# Patient Record
Sex: Female | Born: 1990 | Race: White | Hispanic: No | Marital: Single
Health system: Southern US, Community
[De-identification: ages and names within clinical notes are randomized; demographics above are authoritative.]

## PROBLEM LIST (undated history)

## (undated) ENCOUNTER — Emergency Department: Payer: Self-pay

## (undated) HISTORY — PX: OTHER SURGICAL HISTORY: SHX169

---

## 2012-02-26 ENCOUNTER — Ambulatory Visit: Payer: Self-pay | Admitting: Obstetrics & Gynecology

## 2013-01-06 ENCOUNTER — Ambulatory Visit: Payer: Self-pay | Admitting: Family Medicine

## 2014-02-08 ENCOUNTER — Ambulatory Visit: Payer: Self-pay | Admitting: Adult Health

## 2016-04-17 IMAGING — CT CT HEAD WITHOUT CONTRAST
1 series · 16 of 30 positions shown, 20 images · non-contrast
Comparison: None.

CLINICAL DATA: Glass fell on patient's head injuring left temple
last [REDACTED]. Now with blurry vision.

EXAM:
CT HEAD WITHOUT CONTRAST
TECHNIQUE: Contiguous axial images were obtained from the base of the skull
through the vertex without intravenous contrast.

[Series 2: head wo · axial · 0.42mm/px · z∈[+1119,+1247]mm · 16 of 30 slices shown, 20 images]
[im 2/30  brain]
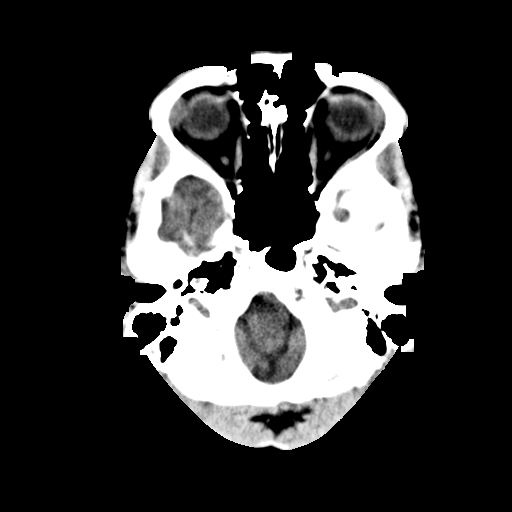
[im 2/30  bone]
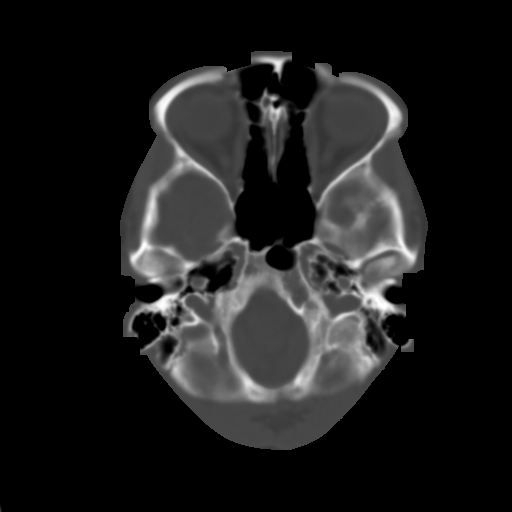
[im 4/30  brain]
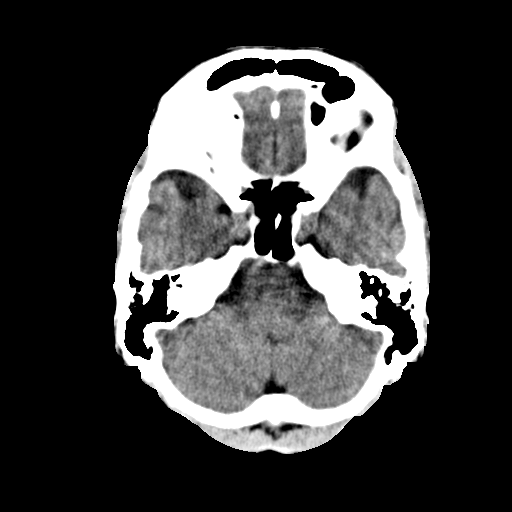
[im 6/30  brain]
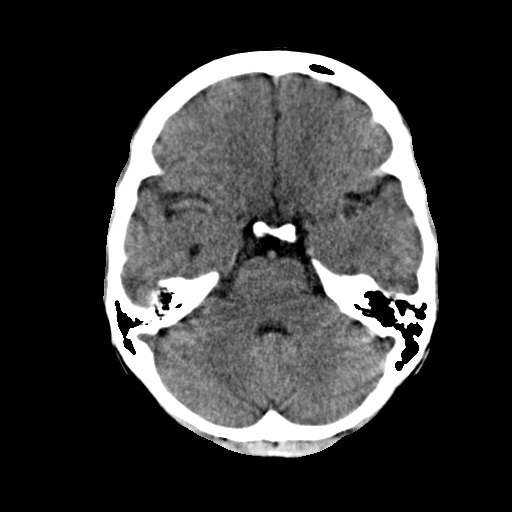
[im 8/30  brain]
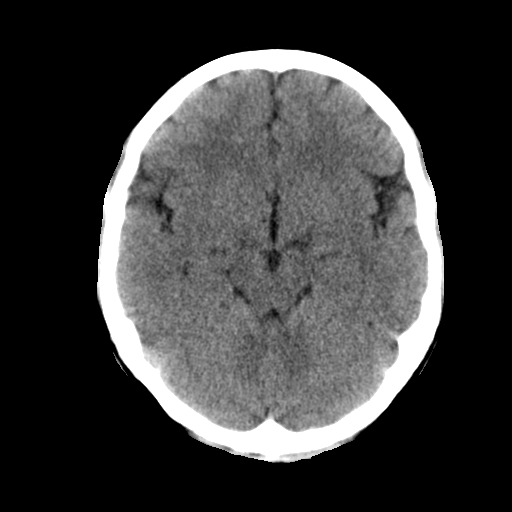
[im 9/30  brain]
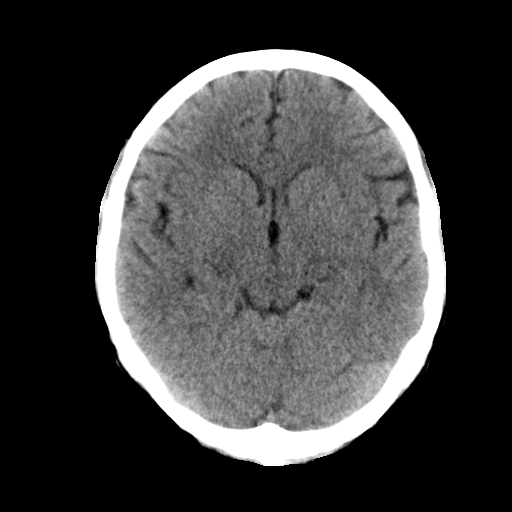
[im 9/30  bone]
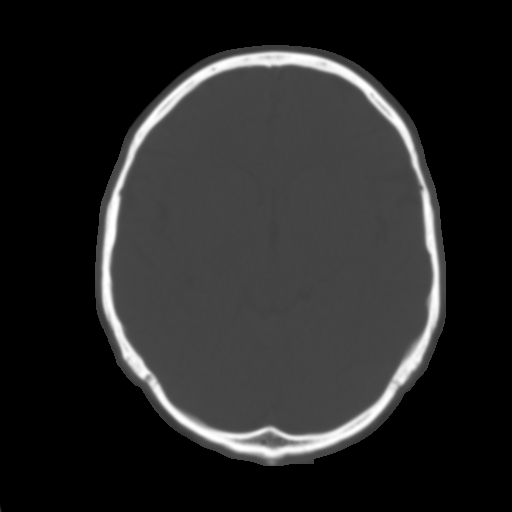
[im 11/30  brain]
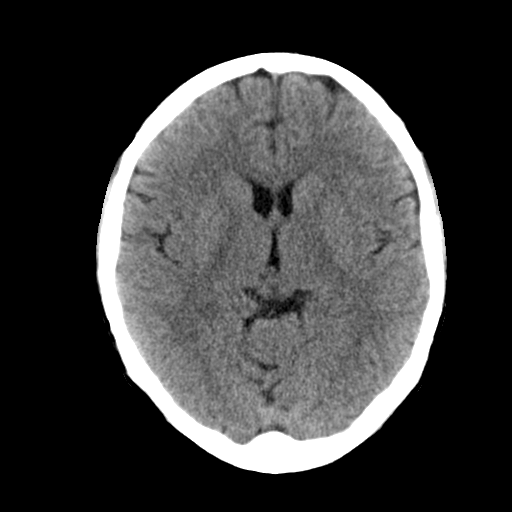
[im 13/30  brain]
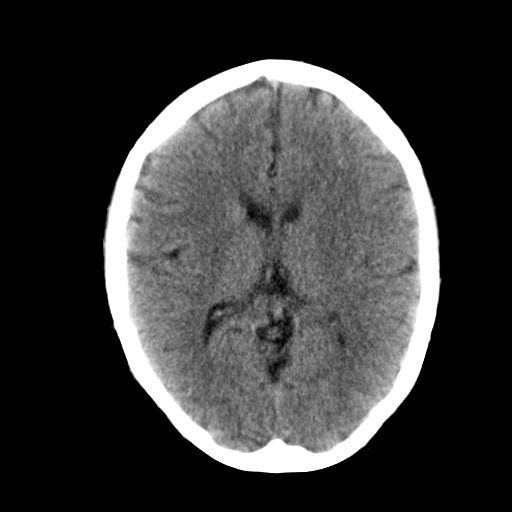
[im 15/30  brain]
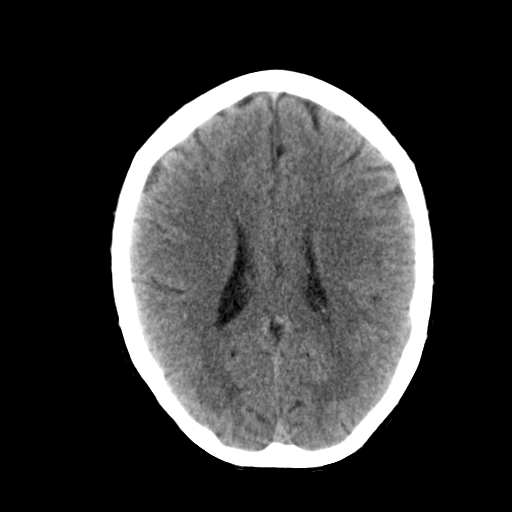
[im 16/30  brain]
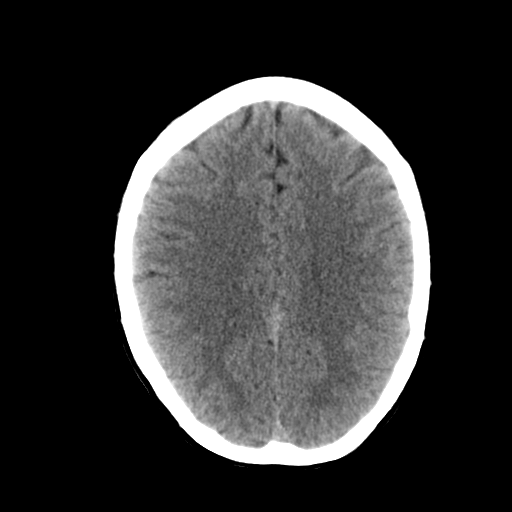
[im 16/30  bone]
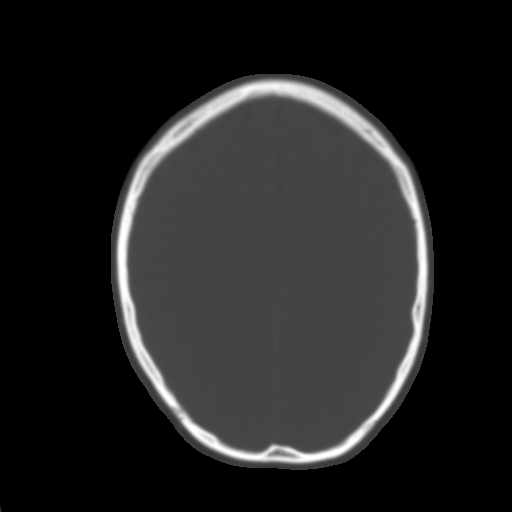
[im 18/30  brain]
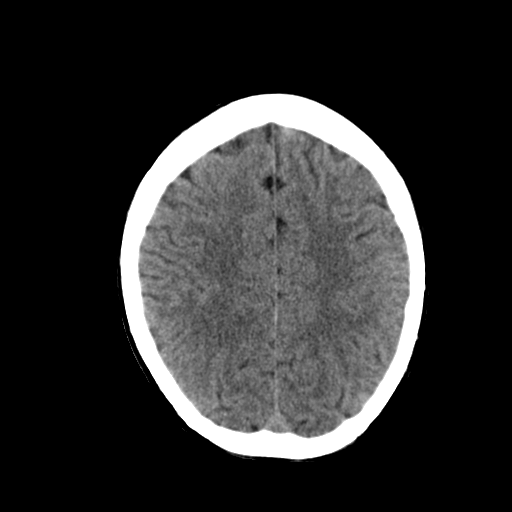
[im 20/30  brain]
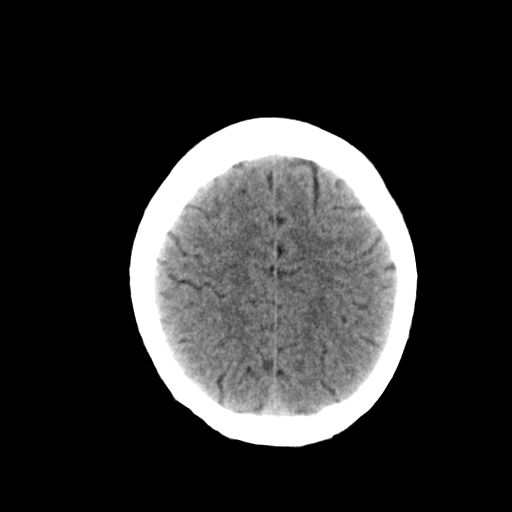
[im 22/30  brain]
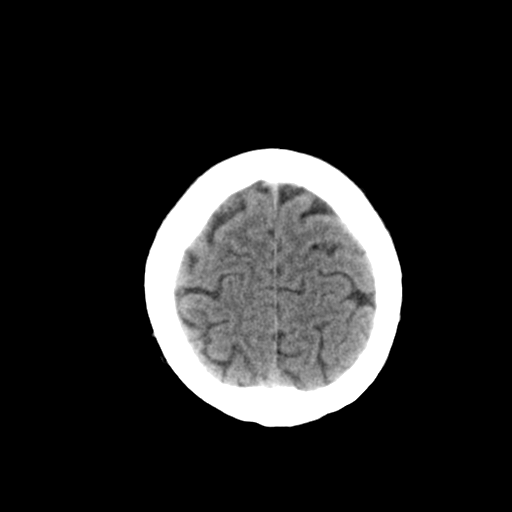
[im 23/30  brain]
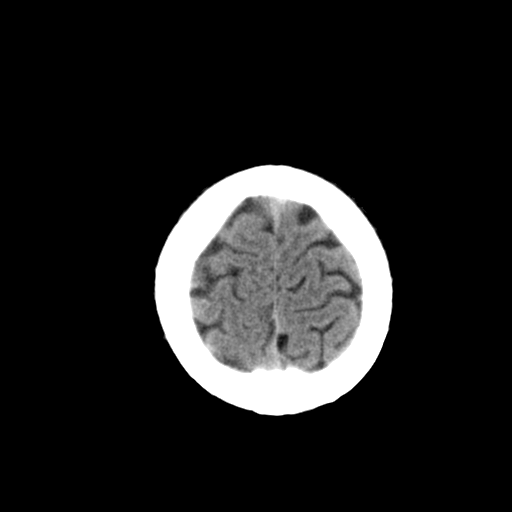
[im 23/30  bone]
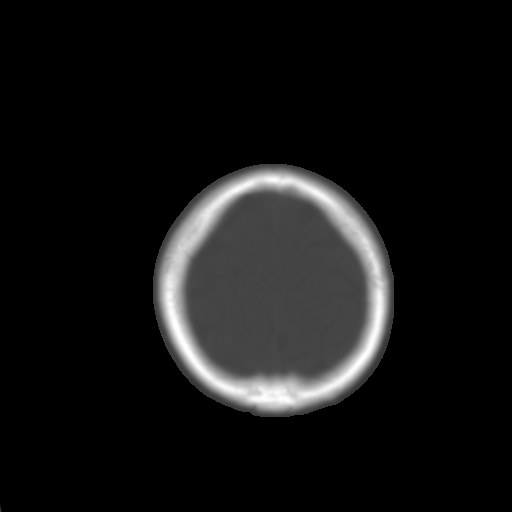
[im 25/30  brain]
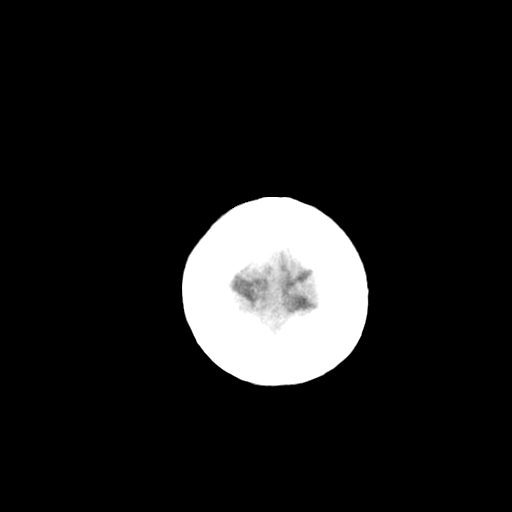
[im 27/30  brain]
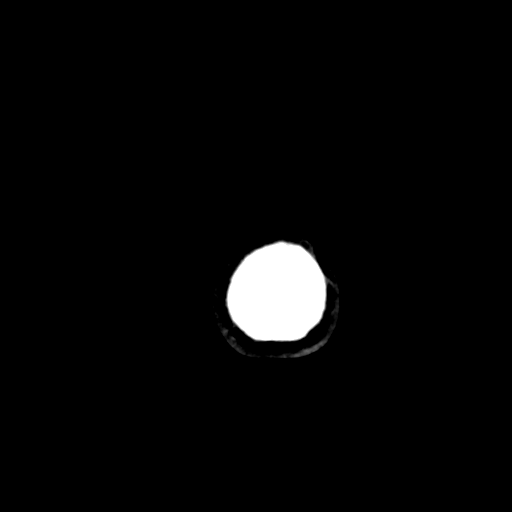
[im 29/30  brain]
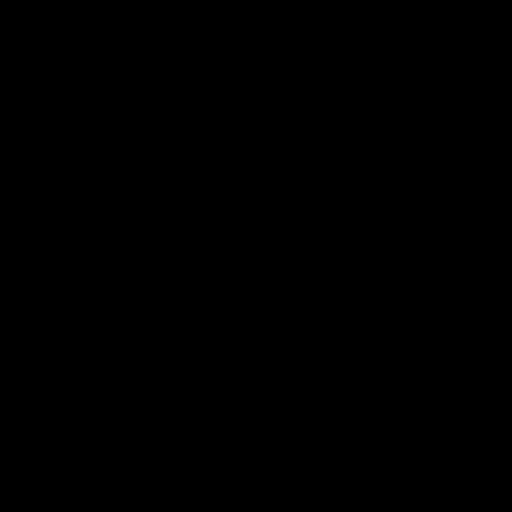

[16 of 30 positions shown; findings below may reference images not displayed]

FINDINGS: Regional soft tissues appear normal with special attention paid to
the soft tissues about the left temporal calvarium. No radiopaque
foreign body. No displaced calvarial fracture.

Gray-white differentiation is maintained. No CT evidence of acute
large territory infarct. No intraparenchymal or extra-axial mass or
hemorrhage. Normal size and configuration of the ventricles and
basilar cisterns. No midline shift.

Limited visualization of the paranasal sinuses and mastoid air cells
are normal. No air-fluid levels.
IMPRESSION: Negative noncontrast head CT. Specifically, no radiopaque foreign
body or displaced calvarial fracture.

## 2019-07-18 NOTE — Progress Notes (Signed)
07/19/19 12:52 PM   Brenda Scott 1990/10/16 [id number]  Referring provider: Darcella Gasman, MD [address] [place],  [place] [address] Chief Complaint  Patient presents with   Pelvic Pain    HPI: Brenda Scott is a 29 y.o. F who presents today for the evaluation and management of pelvic pain.   Visited PCP on 06/23/19 c/o of persistent bladder pain. Associated UA, culture and trial of conservative management (avoiding bladder irritants, warm compresses, hydration), and PE unremarkable.   She reports of constant urinary urgency however not able to void when she goes to restroom onset abruptly 2 months ago. Not bothersome at night.  When she is active, doing hair or keeping busy, she does not have as many issues.  If she sitting around, she often avoid as much as every 15 minutes.  No dysuria or gross hematuria.  She denies weight loss or weight gain, constipation, and bowel issues.   She primarily drinks water and a cup of coffee q morning. PVR minimal.   She reports of having an IUD and curious whether symptoms associated w/ possible pregnancy. She states of having 1 daughter.   PMH: History reviewed. No pertinent past medical history.  Surgical History: Past Surgical History:  Procedure Laterality Date   child birth      Home Medications:  Allergies as of 07/19/2019   Not on File     Medication List       Accurate as of July 19, 2019 12:52 PM. If you have any questions, ask your nurse or doctor.        levonorgestrel 20 MCG/24HR IUD Commonly known as: MIRENA 1 each by Intrauterine route once.   oxybutynin 5 MG tablet Commonly known as: DITROPAN Take 1 tablet (5 mg total) by mouth every 8 (eight) hours as needed for bladder spasms. Started by: Hollice Espy, MD       Allergies: Not on File  Family History: History reviewed. No pertinent family history.  Social History:  has no history on file for tobacco, alcohol, and  drug.   Physical Exam: BP 123/61    Pulse 85    Ht 5\' 3"  (1.6 m)    Wt 125 lb (56.7 kg)    LMP  (LMP Unknown) Comment: patient has IUD   BMI 22.14 kg/m   Constitutional:  Alert and oriented, No acute distress. HEENT: [place] AT, moist mucus membranes.  Trachea midline, no masses. Cardiovascular: No clubbing, cyanosis, or edema. Respiratory: Normal respiratory effort, no increased work of breathing. Skin: No rashes, bruises or suspicious lesions. Neurologic: Grossly intact, no focal deficits, moving all 4 extremities. Psychiatric: Normal mood and affect.  Laboratory Data:  Urinalysis Negative   Pertinent Imaging: Results for orders placed or performed in visit on 07/19/19  BLADDER SCAN AMB NON-IMAGING  Result Value Ref Range   Scan Result 35 ML    Assessment & Plan:    1. Pelvic pain  Intermittent, not severe Possibly related to #2   2. Urinary frequency/urgency  Symptoms fairly recent w/ abrupt onset UA today negative but will send off for ureaplasma and mycoplasma  Adequate emptying of bladder No issues w/ constipation as confounding factors or vaginal symptoms  Reviewed warning symptoms Will treat overactivity w/ oxybutynin Desires pregnancy test today    [facility] [address], [place], [place] [address] [phone]  I, Lucas Mallow, am acting as a scribe for Dr. Hollice Espy,  I have  reviewed the above documentation for accuracy and completeness, and I agree with the above.   Brenda Scotland, MD

## 2019-07-19 ENCOUNTER — Encounter: Payer: Self-pay | Admitting: Urology

## 2019-07-19 ENCOUNTER — Other Ambulatory Visit: Payer: Self-pay

## 2019-07-19 ENCOUNTER — Ambulatory Visit (INDEPENDENT_AMBULATORY_CARE_PROVIDER_SITE_OTHER): Payer: Self-pay | Admitting: Urology

## 2019-07-19 VITALS — BP 123/61 | HR 85 | Ht 63.0 in | Wt 125.0 lb

## 2019-07-19 DIAGNOSIS — R102 Pelvic and perineal pain: Secondary | ICD-10-CM

## 2019-07-19 LAB — URINALYSIS, COMPLETE
Bilirubin, UA: NEGATIVE
Glucose, UA: NEGATIVE
Ketones, UA: NEGATIVE
Leukocytes,UA: NEGATIVE
Nitrite, UA: NEGATIVE
Specific Gravity, UA: 1.025 (ref 1.005–1.030)
Urobilinogen, Ur: 0.2 mg/dL (ref 0.2–1.0)
pH, UA: 5 (ref 5.0–7.5)

## 2019-07-19 LAB — MICROSCOPIC EXAMINATION

## 2019-07-19 LAB — BLADDER SCAN AMB NON-IMAGING: Scan Result: 35

## 2019-07-19 MED ORDER — OXYBUTYNIN CHLORIDE 5 MG PO TABS: 5.0000 mg | ORAL_TABLET | Freq: Three times a day (TID) | ORAL | 2 refills | Status: AC | PRN

## 2019-07-20 LAB — HCG, SERUM, QUALITATIVE: hCG,Beta Subunit,Qual,Serum: NEGATIVE m[IU]/mL (ref <6)

## 2019-07-26 LAB — MYCOPLASMA / UREAPLASMA CULTURE
Mycoplasma hominis Culture: NEGATIVE
Ureaplasma urealyticum: NEGATIVE
